# Patient Record
Sex: Male | Born: 1945 | Race: White | Hispanic: No | Marital: Single
Health system: Southern US, Community
[De-identification: ages and names within clinical notes are randomized; demographics above are authoritative.]

---

## 2006-03-23 ENCOUNTER — Encounter: Admission: RE | Admit: 2006-03-23 | Discharge: 2006-03-23 | Payer: Self-pay | Admitting: Specialist

## 2009-06-28 ENCOUNTER — Encounter: Admission: RE | Admit: 2009-06-28 | Discharge: 2009-06-28 | Payer: Self-pay | Admitting: Orthopedic Surgery

## 2014-03-12 ENCOUNTER — Other Ambulatory Visit: Payer: Self-pay | Admitting: Neurosurgery

## 2014-03-12 DIAGNOSIS — M47817 Spondylosis without myelopathy or radiculopathy, lumbosacral region: Secondary | ICD-10-CM

## 2014-03-21 ENCOUNTER — Ambulatory Visit
Admission: RE | Admit: 2014-03-21 | Discharge: 2014-03-21 | Disposition: A | Discharge status: Home / Self Care | Payer: Medicare Other | Source: Ambulatory Visit | Attending: Neurosurgery | Admitting: Neurosurgery

## 2014-03-21 DIAGNOSIS — M47817 Spondylosis without myelopathy or radiculopathy, lumbosacral region: Secondary | ICD-10-CM

## 2016-01-11 IMAGING — MR MR LUMBAR SPINE W/O CM
4 of 5 series · 26 of 48 positions shown · non-contrast
Comparison: 02/27/2014

CLINICAL DATA: Low back pain.  Bilateral leg weakness.

EXAM:
MRI LUMBAR SPINE WITHOUT CONTRAST
TECHNIQUE: Multiplanar, multisequence MR imaging of the lumbar spine was
performed. No intravenous contrast was administered.

[Series 3: T2 · sagittal · 4.0mm · 0.55mm/px · 6 of 12 slices shown (1 of 2)]
[im 1/12]
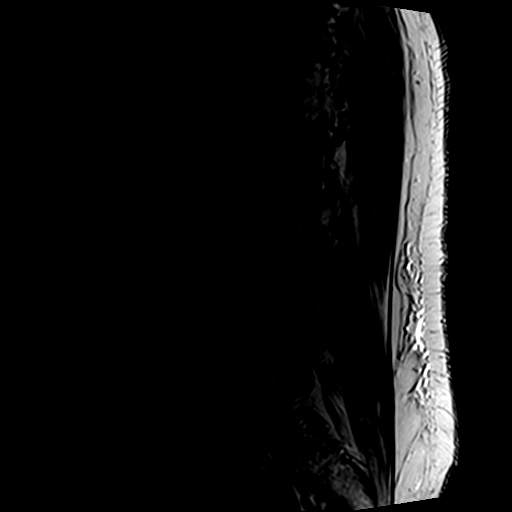
[im 3/12]
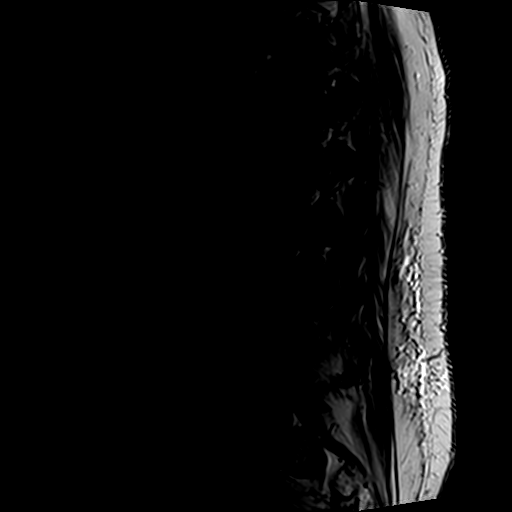
[im 5/12]
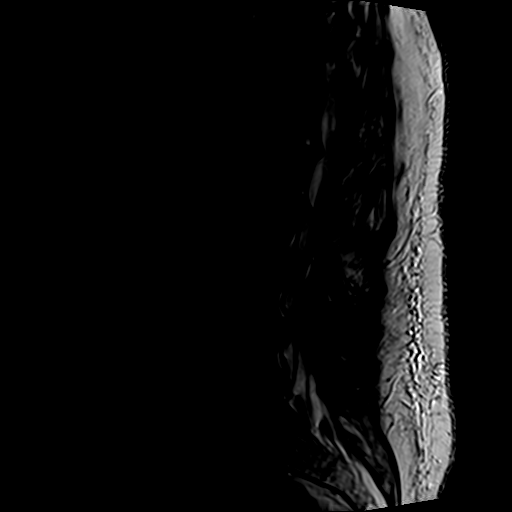
[im 7/12]
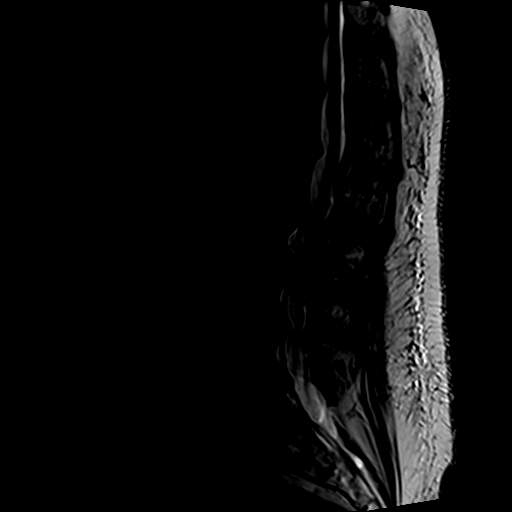
[im 9/12]
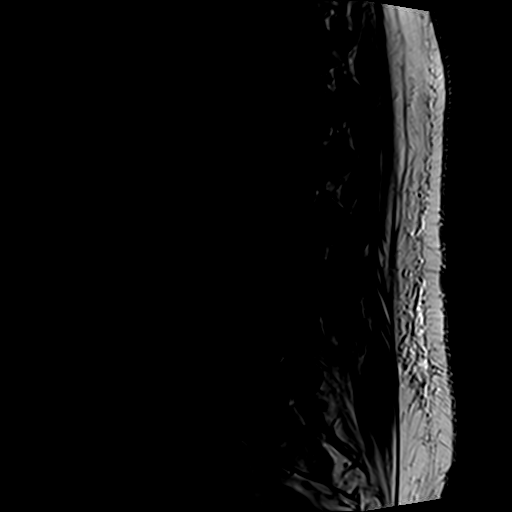
[im 12/12]
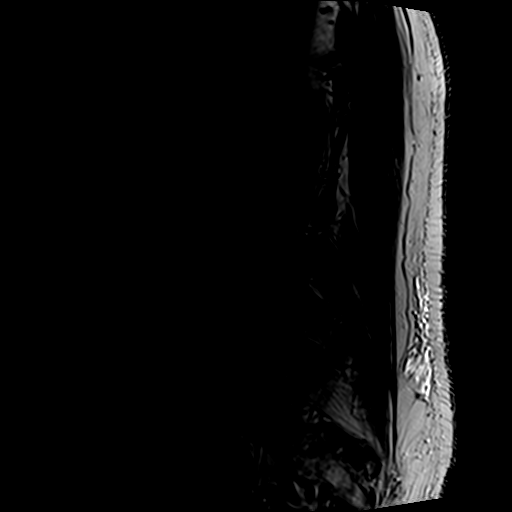

[Series 4: T1 · sagittal · 4.0mm · 0.55mm/px · 5 of 12 slices shown (1 of 2)]
[im 1/12]
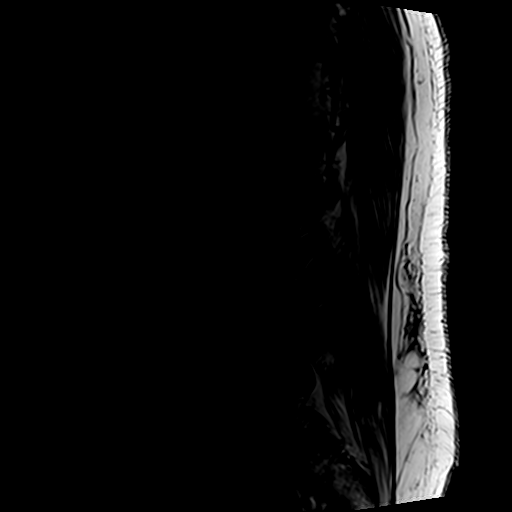
[im 3/12]
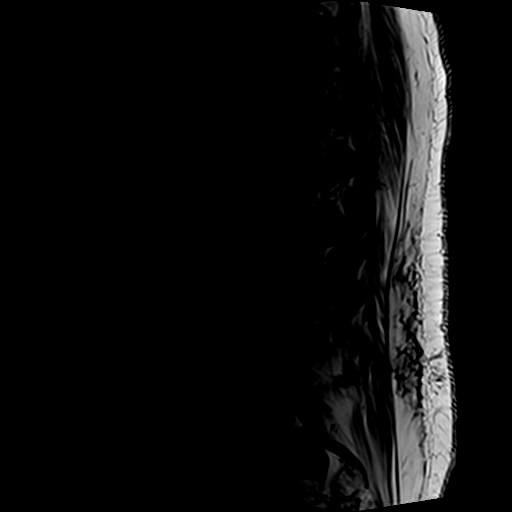
[im 6/12]
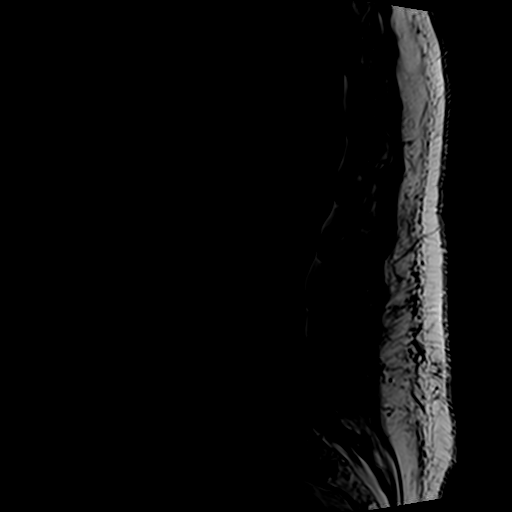
[im 9/12]
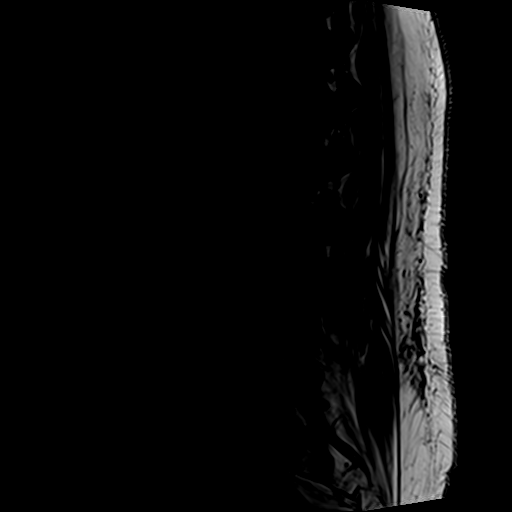
[im 12/12]
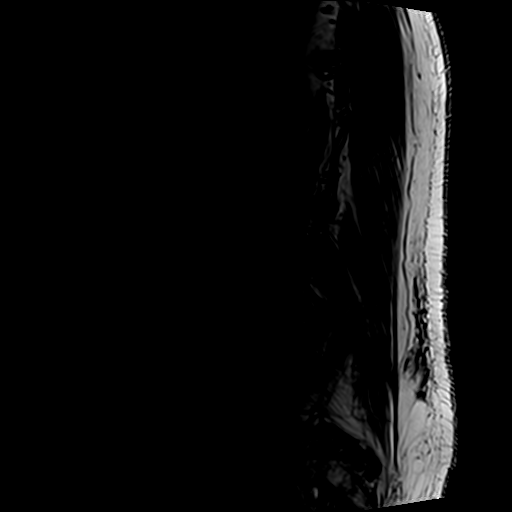

[Series 6: T2 · axial · 4.0mm · 0.70mm/px · z∈[-66,+119]mm · 10 of 38 slices shown (2 of 2)]
[im 3/38]
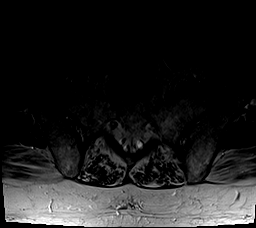
[im 5/38]
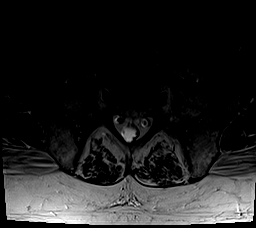
[im 8/38]
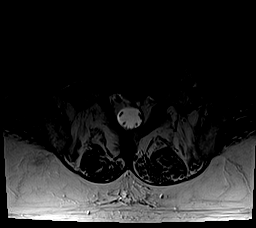
[im 13/38]
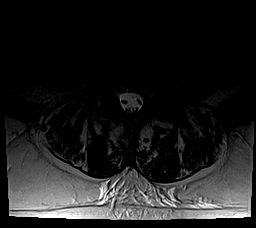
[im 18/38]
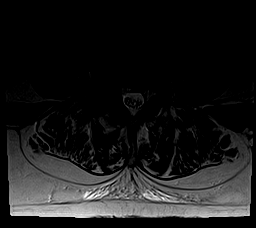
[im 20/38]
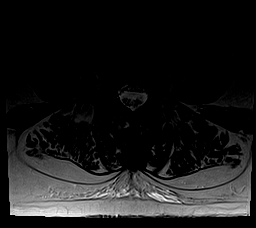
[im 23/38]
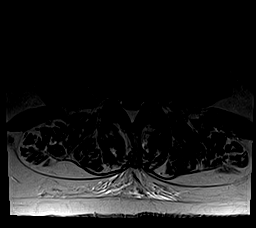
[im 28/38]
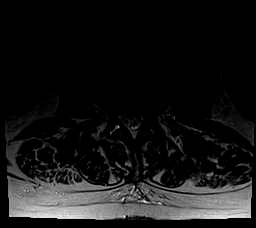
[im 33/38]
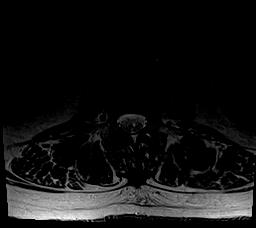
[im 38/38]
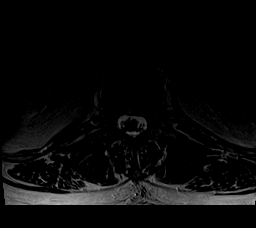

[Series 7: T1 · axial · 4.0mm · 0.35mm/px · z∈[-66,+94]mm · 5 of 38 slices shown (2 of 2)]
[im 3/38]
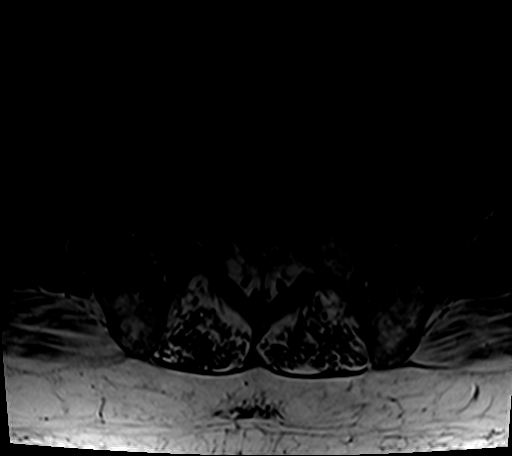
[im 5/38]
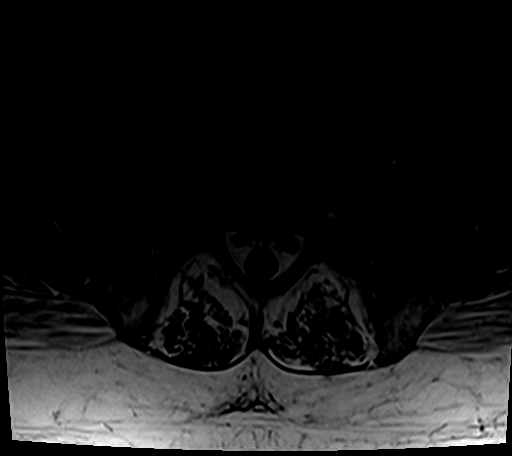
[im 8/38]
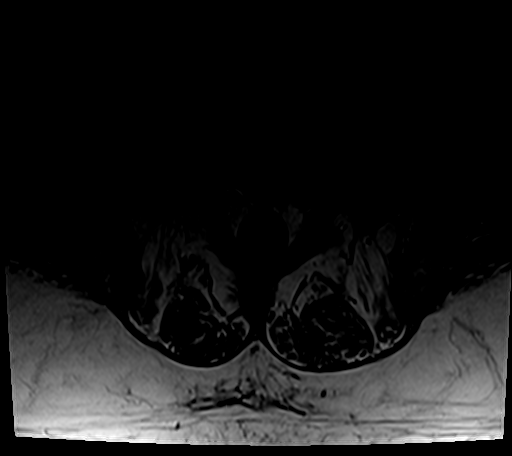
[im 20/38]
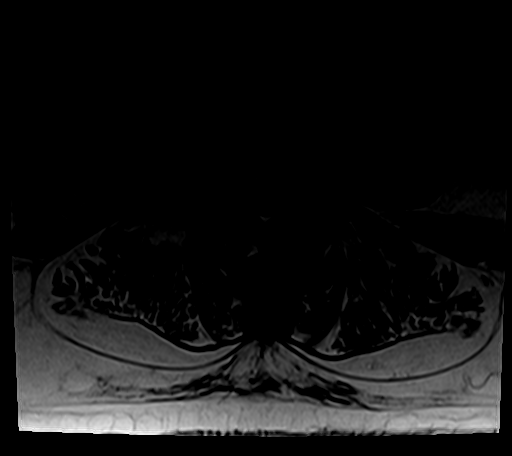
[im 33/38]
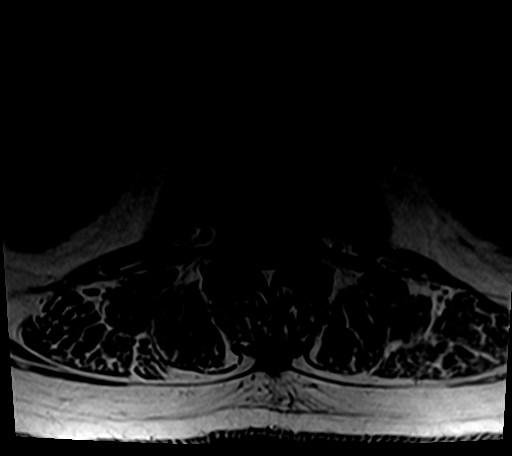

[26 of 48 positions shown; findings below may reference images not displayed]

FINDINGS: A transitional lumbosacral vertebra is assumed to represent the S1
level. Careful correlation with this numbering strategy prior to any
procedural intervention would be recommended. The conus medullaris
appears normal. Conus level: L1-2.

There is 3 mm degenerative retrolisthesis at L5-S1. Disc desiccation
observed at L4-5 and L5-S1 with loss of disc height at L1-2, L2-3,
and L3-4.

Mild multilevel degenerative endplate findings noted.

Additional findings at individual levels are as follows:

T11-12: Mild central narrowing of the thecal sac due to disc bulge.
This level is only included on the parasagittal images.

T12-L1: No impingement. Mild disc bulge. This level is only included
on the parasagittal images.

L1-2:  No impingement.  Broad disc bulge.

L2-3: No impingement. Diffuse disc bulge. Bilateral facet
arthropathy with small right facet effusion.

L3-4: Moderate central narrowing of the thecal sac with mild left
foraminal stenosis and mild displacement of the left L3 nerve in the
lateral extraforaminal space due to disc bulge, intervertebral
spurring, and facet spurring. There is also mild prominence of the
dorsal epidural adipose tissue.

L4-5: Moderate central narrowing of the thecal sac with moderate
right and mild left foraminal stenosis and mild right subarticular
lateral recess stenosis due to disc bulge, intervertebral spurring,
right foraminal disc protrusion, and facet arthropathy.

L5-S1: Moderate left and mild right foraminal stenosis due to
intervertebral and facet spurring and disc bulge. Small central disc
protrusion noted.

S1-2:  No impingement.  Rudimentary disc material.
IMPRESSION: 1. Spondylosis and degenerative disc disease causing moderate
impingement at L3-4, L4-5, and L5-S1; and mild impingement at
T11-12, as detailed above.
2. A transitional lumbosacral vertebra is assumed to represent the
S1 level. Careful correlation with this numbering strategy prior to
any procedural intervention would be recommended.
